# Patient Record
Sex: Male | Born: 2005 | Race: White | Hispanic: No | Marital: Single | Smoking: Current some day smoker
Health system: Southern US, Community
[De-identification: ages and names within clinical notes are randomized; demographics above are authoritative.]

## PROBLEM LIST (undated history)

## (undated) DIAGNOSIS — F419 Anxiety disorder, unspecified: Secondary | ICD-10-CM

## (undated) DIAGNOSIS — G43909 Migraine, unspecified, not intractable, without status migrainosus: Secondary | ICD-10-CM

## (undated) DIAGNOSIS — K3532 Acute appendicitis with perforation and localized peritonitis, without abscess: Secondary | ICD-10-CM

---
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
## 2023-06-05 ENCOUNTER — Other Ambulatory Visit: Payer: Self-pay

## 2023-06-05 ENCOUNTER — Emergency Department (HOSPITAL_COMMUNITY)

## 2023-06-05 ENCOUNTER — Encounter (HOSPITAL_COMMUNITY): Payer: Self-pay | Admitting: *Deleted

## 2023-06-05 ENCOUNTER — Emergency Department (HOSPITAL_COMMUNITY): Admission: EM | Admit: 2023-06-05 | Discharge: 2023-06-05 | Disposition: A | Discharge status: Home / Self Care

## 2023-06-05 DIAGNOSIS — R079 Chest pain, unspecified: Secondary | ICD-10-CM | POA: Diagnosis not present

## 2023-06-05 DIAGNOSIS — D72829 Elevated white blood cell count, unspecified: Secondary | ICD-10-CM | POA: Diagnosis not present

## 2023-06-05 DIAGNOSIS — M542 Cervicalgia: Secondary | ICD-10-CM | POA: Insufficient documentation

## 2023-06-05 DIAGNOSIS — S80211A Abrasion, right knee, initial encounter: Secondary | ICD-10-CM | POA: Diagnosis not present

## 2023-06-05 DIAGNOSIS — Z23 Encounter for immunization: Secondary | ICD-10-CM | POA: Insufficient documentation

## 2023-06-05 DIAGNOSIS — S8991XA Unspecified injury of right lower leg, initial encounter: Secondary | ICD-10-CM | POA: Diagnosis present

## 2023-06-05 DIAGNOSIS — Y92410 Unspecified street and highway as the place of occurrence of the external cause: Secondary | ICD-10-CM | POA: Diagnosis not present

## 2023-06-05 LAB — CBC WITH DIFFERENTIAL/PLATELET
Abs Immature Granulocytes: 0.1 10*3/uL — ABNORMAL HIGH (ref 0.00–0.07)
Basophils Absolute: 0.1 10*3/uL (ref 0.0–0.1)
Basophils Relative: 1 %
Eosinophils Absolute: 0.1 10*3/uL (ref 0.0–0.5)
Eosinophils Relative: 0 %
HCT: 45.8 % (ref 39.0–52.0)
Hemoglobin: 15.4 g/dL (ref 13.0–17.0)
Immature Granulocytes: 1 %
Lymphocytes Relative: 11 %
Lymphs Abs: 2 10*3/uL (ref 0.7–4.0)
MCH: 28.3 pg (ref 26.0–34.0)
MCHC: 33.6 g/dL (ref 30.0–36.0)
MCV: 84.2 fL (ref 80.0–100.0)
Monocytes Absolute: 1.2 10*3/uL — ABNORMAL HIGH (ref 0.1–1.0)
Monocytes Relative: 6 %
Neutro Abs: 15.5 10*3/uL — ABNORMAL HIGH (ref 1.7–7.7)
Neutrophils Relative %: 81 %
Platelets: 301 10*3/uL (ref 150–400)
RBC: 5.44 MIL/uL (ref 4.22–5.81)
RDW: 12.2 % (ref 11.5–15.5)
WBC: 19 10*3/uL — ABNORMAL HIGH (ref 4.0–10.5)
nRBC: 0 % (ref 0.0–0.2)

## 2023-06-05 LAB — COMPREHENSIVE METABOLIC PANEL WITH GFR
ALT: 26 U/L (ref 0–44)
AST: 26 U/L (ref 15–41)
Albumin: 4.2 g/dL (ref 3.5–5.0)
Alkaline Phosphatase: 54 U/L (ref 38–126)
Anion gap: 11 (ref 5–15)
BUN: 18 mg/dL (ref 6–20)
CO2: 24 mmol/L (ref 22–32)
Calcium: 9.3 mg/dL (ref 8.9–10.3)
Chloride: 103 mmol/L (ref 98–111)
Creatinine, Ser: 0.78 mg/dL (ref 0.61–1.24)
GFR, Estimated: >60 mL/min (ref >60)
Glucose, Bld: 103 mg/dL — ABNORMAL HIGH (ref 70–99)
Potassium: 4 mmol/L (ref 3.5–5.1)
Sodium: 138 mmol/L (ref 135–145)
Total Bilirubin: 1 mg/dL (ref 0.0–1.2)
Total Protein: 7.4 g/dL (ref 6.5–8.1)

## 2023-06-05 MED ORDER — FENTANYL CITRATE PF 50 MCG/ML IJ SOSY
25.0000 ug | PREFILLED_SYRINGE | Freq: Once | INTRAMUSCULAR | Status: AC
  Administered 2023-06-05: 25 ug via INTRAVENOUS
  Filled 2023-06-05: qty 1

## 2023-06-05 MED ORDER — KETOROLAC TROMETHAMINE 15 MG/ML IJ SOLN
15.0000 mg | Freq: Once | INTRAMUSCULAR | Status: AC
  Administered 2023-06-05: 15 mg via INTRAVENOUS
  Filled 2023-06-05: qty 1

## 2023-06-05 MED ORDER — IOHEXOL 350 MG/ML SOLN
75.0000 mL | Freq: Once | INTRAVENOUS | Status: AC | PRN
  Administered 2023-06-05: 75 mL via INTRAVENOUS

## 2023-06-05 MED ORDER — ACETAMINOPHEN 500 MG PO TABS: 1000.0000 mg | ORAL_TABLET | Freq: Once | ORAL | Status: DC

## 2023-06-05 MED ORDER — METHOCARBAMOL 500 MG PO TABS: 500.0000 mg | ORAL_TABLET | Freq: Two times a day (BID) | ORAL | 0 refills | Status: AC

## 2023-06-05 MED ORDER — METOCLOPRAMIDE HCL 5 MG/ML IJ SOLN
10.0000 mg | Freq: Once | INTRAMUSCULAR | Status: AC
  Administered 2023-06-05: 10 mg via INTRAVENOUS
  Filled 2023-06-05: qty 2

## 2023-06-05 MED ORDER — TETANUS-DIPHTH-ACELL PERTUSSIS 5-2.5-18.5 LF-MCG/0.5 IM SUSY
0.5000 mL | PREFILLED_SYRINGE | Freq: Once | INTRAMUSCULAR | Status: AC
Start: 2023-06-05 — End: 2023-06-05
  Administered 2023-06-05: 0.5 mL via INTRAMUSCULAR
  Filled 2023-06-05: qty 0.5
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
## 2023-08-06 ENCOUNTER — Emergency Department (HOSPITAL_COMMUNITY)

## 2023-08-06 ENCOUNTER — Other Ambulatory Visit: Payer: Self-pay

## 2023-08-06 ENCOUNTER — Inpatient Hospital Stay (HOSPITAL_COMMUNITY)
Admission: EM | Admit: 2023-08-06 | Discharge: 2023-08-12 | DRG: 959 | Disposition: A | Discharge status: Home / Self Care | Attending: General Surgery | Admitting: General Surgery

## 2023-08-06 DIAGNOSIS — S42352A Displaced comminuted fracture of shaft of humerus, left arm, initial encounter for closed fracture: Secondary | ICD-10-CM | POA: Diagnosis present

## 2023-08-06 DIAGNOSIS — G5632 Lesion of radial nerve, left upper limb: Secondary | ICD-10-CM | POA: Diagnosis present

## 2023-08-06 DIAGNOSIS — G5732 Lesion of lateral popliteal nerve, left lower limb: Secondary | ICD-10-CM | POA: Diagnosis present

## 2023-08-06 DIAGNOSIS — S01112A Laceration without foreign body of left eyelid and periocular area, initial encounter: Secondary | ICD-10-CM | POA: Diagnosis present

## 2023-08-06 DIAGNOSIS — E559 Vitamin D deficiency, unspecified: Secondary | ICD-10-CM | POA: Diagnosis present

## 2023-08-06 DIAGNOSIS — S82492A Other fracture of shaft of left fibula, initial encounter for closed fracture: Secondary | ICD-10-CM | POA: Diagnosis present

## 2023-08-06 DIAGNOSIS — S5422XA Injury of radial nerve at forearm level, left arm, initial encounter: Secondary | ICD-10-CM | POA: Diagnosis present

## 2023-08-06 DIAGNOSIS — S42302A Unspecified fracture of shaft of humerus, left arm, initial encounter for closed fracture: Secondary | ICD-10-CM | POA: Diagnosis not present

## 2023-08-06 DIAGNOSIS — S8412XA Injury of peroneal nerve at lower leg level, left leg, initial encounter: Secondary | ICD-10-CM | POA: Diagnosis present

## 2023-08-06 DIAGNOSIS — Y92410 Unspecified street and highway as the place of occurrence of the external cause: Secondary | ICD-10-CM | POA: Diagnosis not present

## 2023-08-06 DIAGNOSIS — D62 Acute posthemorrhagic anemia: Secondary | ICD-10-CM | POA: Diagnosis not present

## 2023-08-06 DIAGNOSIS — F17200 Nicotine dependence, unspecified, uncomplicated: Secondary | ICD-10-CM | POA: Diagnosis present

## 2023-08-06 DIAGNOSIS — M21372 Foot drop, left foot: Secondary | ICD-10-CM | POA: Diagnosis not present

## 2023-08-06 DIAGNOSIS — E8889 Other specified metabolic disorders: Secondary | ICD-10-CM | POA: Diagnosis present

## 2023-08-06 LAB — CBC
HCT: 44.4 % (ref 39.0–52.0)
Hemoglobin: 14.7 g/dL (ref 13.0–17.0)
MCH: 28.3 pg (ref 26.0–34.0)
MCHC: 33.1 g/dL (ref 30.0–36.0)
MCV: 85.5 fL (ref 80.0–100.0)
Platelets: 309 10*3/uL (ref 150–400)
RBC: 5.19 MIL/uL (ref 4.22–5.81)
RDW: 12.1 % (ref 11.5–15.5)
WBC: 17.4 10*3/uL — ABNORMAL HIGH (ref 4.0–10.5)
nRBC: 0 % (ref 0.0–0.2)

## 2023-08-06 LAB — COMPREHENSIVE METABOLIC PANEL WITH GFR
ALT: 35 U/L (ref 0–44)
AST: 31 U/L (ref 15–41)
Albumin: 3.8 g/dL (ref 3.5–5.0)
Alkaline Phosphatase: 59 U/L (ref 38–126)
Anion gap: 10 (ref 5–15)
BUN: 23 mg/dL — ABNORMAL HIGH (ref 6–20)
CO2: 22 mmol/L (ref 22–32)
Calcium: 8.7 mg/dL — ABNORMAL LOW (ref 8.9–10.3)
Chloride: 109 mmol/L (ref 98–111)
Creatinine, Ser: 0.78 mg/dL (ref 0.61–1.24)
GFR, Estimated: >60 mL/min (ref >60)
Glucose, Bld: 137 mg/dL — ABNORMAL HIGH (ref 70–99)
Potassium: 3.6 mmol/L (ref 3.5–5.1)
Sodium: 141 mmol/L (ref 135–145)
Total Bilirubin: 1.3 mg/dL — ABNORMAL HIGH (ref 0.0–1.2)
Total Protein: 6.5 g/dL (ref 6.5–8.1)

## 2023-08-06 LAB — I-STAT CHEM 8, ED
BUN: 31 mg/dL — ABNORMAL HIGH (ref 6–20)
Calcium, Ion: 1.12 mmol/L — ABNORMAL LOW (ref 1.15–1.40)
Chloride: 105 mmol/L (ref 98–111)
Creatinine, Ser: 0.8 mg/dL (ref 0.61–1.24)
Glucose, Bld: 133 mg/dL — ABNORMAL HIGH (ref 70–99)
HCT: 42 % (ref 39.0–52.0)
Hemoglobin: 14.3 g/dL (ref 13.0–17.0)
Potassium: 3.9 mmol/L (ref 3.5–5.1)
Sodium: 143 mmol/L (ref 135–145)
TCO2: 25 mmol/L (ref 22–32)

## 2023-08-06 LAB — PROTIME-INR
INR: 1 (ref 0.8–1.2)
Prothrombin Time: 13.7 s (ref 11.4–15.2)

## 2023-08-06 LAB — I-STAT CG4 LACTIC ACID, ED: Lactic Acid, Venous: 2.1 mmol/L (ref 0.5–1.9)

## 2023-08-06 LAB — ETHANOL: Alcohol, Ethyl (B): <15 mg/dL (ref <15)

## 2023-08-06 LAB — SAMPLE TO BLOOD BANK

## 2023-08-06 LAB — CK: Total CK: 183 U/L (ref 49–397)

## 2023-08-06 MED ORDER — ONDANSETRON 4 MG PO TBDP: 4.0000 mg | ORAL_TABLET | Freq: Four times a day (QID) | ORAL | Status: DC | PRN

## 2023-08-06 MED ORDER — ENOXAPARIN SODIUM 30 MG/0.3ML IJ SOSY
30.0000 mg | PREFILLED_SYRINGE | Freq: Two times a day (BID) | INTRAMUSCULAR | Status: DC
  Administered 2023-08-07 – 2023-08-12 (×11): 30 mg via SUBCUTANEOUS
  Filled 2023-08-06 (×11): qty 0.3

## 2023-08-06 MED ORDER — POLYETHYLENE GLYCOL 3350 17 G PO PACK: 17.0000 g | PACK | Freq: Every day | ORAL | Status: DC | PRN

## 2023-08-06 MED ORDER — OXYCODONE HCL 5 MG PO TABS
5.0000 mg | ORAL_TABLET | Freq: Four times a day (QID) | ORAL | Status: DC | PRN
  Administered 2023-08-06: 10 mg via ORAL
  Administered 2023-08-07: 5 mg via ORAL
  Administered 2023-08-07 – 2023-08-08 (×3): 10 mg via ORAL
  Administered 2023-08-08: 5 mg via ORAL
  Administered 2023-08-09 – 2023-08-11 (×5): 10 mg via ORAL
  Filled 2023-08-06 (×12): qty 2

## 2023-08-06 MED ORDER — SODIUM CHLORIDE 0.9 % IV SOLN
INTRAVENOUS | Status: AC | PRN
  Administered 2023-08-06: 1000 mL via INTRAVENOUS

## 2023-08-06 MED ORDER — DOCUSATE SODIUM 100 MG PO CAPS
100.0000 mg | ORAL_CAPSULE | Freq: Two times a day (BID) | ORAL | Status: DC
  Administered 2023-08-06 – 2023-08-11 (×10): 100 mg via ORAL
  Filled 2023-08-06 (×12): qty 1

## 2023-08-06 MED ORDER — LACTATED RINGERS IV SOLN: INTRAVENOUS | Status: AC

## 2023-08-06 MED ORDER — ONDANSETRON HCL 4 MG/2ML IJ SOLN
4.0000 mg | Freq: Four times a day (QID) | INTRAMUSCULAR | Status: DC | PRN
  Administered 2023-08-06: 4 mg via INTRAVENOUS
  Filled 2023-08-06: qty 2

## 2023-08-06 MED ORDER — HYDROMORPHONE HCL 1 MG/ML IJ SOLN
0.5000 mg | INTRAMUSCULAR | Status: DC | PRN
  Administered 2023-08-07 – 2023-08-09 (×3): 0.5 mg via INTRAVENOUS
  Filled 2023-08-06 (×3): qty 0.5

## 2023-08-06 MED ORDER — ACETAMINOPHEN 500 MG PO TABS
1000.0000 mg | ORAL_TABLET | Freq: Four times a day (QID) | ORAL | Status: DC
  Administered 2023-08-06 – 2023-08-12 (×20): 1000 mg via ORAL
  Filled 2023-08-06 (×22): qty 2

## 2023-08-06 MED ORDER — MORPHINE SULFATE (PF) 4 MG/ML IV SOLN
4.0000 mg | Freq: Once | INTRAVENOUS | Status: AC
  Administered 2023-08-06: 4 mg via INTRAVENOUS
  Filled 2023-08-06: qty 1

## 2023-08-06 MED ORDER — HYDROXYZINE HCL 25 MG PO TABS
25.0000 mg | ORAL_TABLET | Freq: Once | ORAL | Status: AC
  Administered 2023-08-06: 25 mg via ORAL
  Filled 2023-08-06: qty 1

## 2023-08-06 MED ORDER — HYDRALAZINE HCL 20 MG/ML IJ SOLN: 10.0000 mg | INTRAMUSCULAR | Status: DC | PRN

## 2023-08-06 MED ORDER — CEFAZOLIN SODIUM-DEXTROSE 2-4 GM/100ML-% IV SOLN
2.0000 g | Freq: Once | INTRAVENOUS | Status: AC
  Administered 2023-08-06: 2 g via INTRAVENOUS
  Filled 2023-08-06: qty 100

## 2023-08-06 MED ORDER — METOPROLOL TARTRATE 5 MG/5ML IV SOLN: 5.0000 mg | Freq: Four times a day (QID) | INTRAVENOUS | Status: DC | PRN

## 2023-08-06 MED ORDER — METHOCARBAMOL 1000 MG/10ML IJ SOLN
500.0000 mg | Freq: Three times a day (TID) | INTRAMUSCULAR | Status: AC
  Filled 2023-08-06: qty 10

## 2023-08-06 MED ORDER — IBUPROFEN 600 MG PO TABS
600.0000 mg | ORAL_TABLET | Freq: Four times a day (QID) | ORAL | Status: DC | PRN
  Administered 2023-08-06 – 2023-08-10 (×5): 600 mg via ORAL
  Filled 2023-08-06 (×6): qty 1

## 2023-08-06 MED ORDER — METHOCARBAMOL 500 MG PO TABS
500.0000 mg | ORAL_TABLET | Freq: Three times a day (TID) | ORAL | Status: AC
  Administered 2023-08-06 – 2023-08-09 (×9): 500 mg via ORAL
  Filled 2023-08-06 (×9): qty 1

## 2023-08-06 MED ORDER — MELATONIN 3 MG PO TABS
3.0000 mg | ORAL_TABLET | Freq: Every evening | ORAL | Status: DC | PRN
Start: 2023-08-06 — End: 2023-08-12

## 2023-08-06 MED ORDER — IOHEXOL 350 MG/ML SOLN
75.0000 mL | Freq: Once | INTRAVENOUS | Status: AC | PRN
  Administered 2023-08-06: 75 mL via INTRAVENOUS
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
## 2023-08-06 NOTE — Progress Notes (Signed)
 Orthopedic Tech Progress Note Patient Details:  Taz ATREUS HASZ 07-22-2005 [id number]  Ortho Devices Type of Ortho Device: Velcro wrist splint, Ankle splint, Coapt Ortho Device/Splint Location: LUE Ortho Device/Splint Interventions: Application   Post Interventions Patient Tolerated: Well  Massie BRAVO Nicolai Labonte 08/06/2023, 1:57 PM
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
## 2023-08-06 NOTE — ED Notes (Signed)
 Call placed to ortho tech for splint application.

## 2023-08-06 NOTE — ED Notes (Signed)
2LNC applied 

## 2023-08-06 NOTE — Progress Notes (Signed)
 Transition of Care [facility]) - CAGE-AID Screening   Patient Details  Name: Craig Santos MRN: [id number] Date of Birth: [birth year]  [facility]) CM/SW Contact:    Bernardino Mayotte, RN Phone Number: 08/06/2023, 7:49 PM   Clinical Narrative:  Patient denies the use of alcohol and illicit substances. Resources not give at this time.  CAGE-AID Screening:    Have You Ever Felt You Ought to Cut Down on Your Drinking or Drug Use?: No Have People Annoyed You By Critizing Your Drinking Or Drug Use?: No Have You Felt Bad Or Guilty About Your Drinking Or Drug Use?: No Have You Ever Had a Drink or Used Drugs First Thing In The Morning to Steady Your Nerves or to Get Rid of a Hangover?: No CAGE-AID Score: 0  Substance Abuse Education Offered: No
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
## 2023-08-06 NOTE — ED Provider Notes (Signed)
  Physical Exam  BP 137/79   Pulse 65   Temp (!) 97.4 F (36.3 C) (Temporal)   Resp 15   Ht 5' 11 (1.803 m)   Wt 104.3 kg   SpO2 98%   BMI 32.08 kg/m     Procedures  .Laceration Repair  Date/Time: 08/06/2023 2:35 PM  Performed by: Donnajean Lynwood DEL, PA-C Authorized by: Donnajean Lynwood DEL, PA-C   Consent:    Consent obtained:  Verbal   Consent given by:  Patient   Risks discussed:  Infection and pain   Alternatives discussed:  No treatment Universal protocol:    Procedure explained and questions answered to patient or proxy's satisfaction: yes     Patient identity confirmed:  Verbally with patient and arm band Anesthesia:    Anesthesia method:  None Laceration details:    Location:  Face   Face location:  L eyebrow   Length (cm):  3 Exploration:    Imaging outcome: foreign body noted   Treatment:    Area cleansed with:  Povidone-iodine   Visualized foreign bodies/material removed: yes   Skin repair:    Repair method:  Tissue adhesive Post-procedure details:    Procedure completion:  Quinton Donnajean Lynwood DEL, PA-C 08/06/23 1437    Dreama Longs, MD 08/06/23 2254

## 2023-08-06 NOTE — ED Notes (Signed)
 C-collar removed per Dreama MD verbal order.

## 2023-08-06 NOTE — ED Notes (Signed)
 Patient transported to X-ray
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
## 2023-08-06 NOTE — ED Notes (Signed)
 CCMD contacted

## 2023-08-07 LAB — CBC
HCT: 39.9 % (ref 39.0–52.0)
Hemoglobin: 13.5 g/dL (ref 13.0–17.0)
MCH: 28 pg (ref 26.0–34.0)
MCHC: 33.8 g/dL (ref 30.0–36.0)
MCV: 82.8 fL (ref 80.0–100.0)
Platelets: 234 10*3/uL (ref 150–400)
RBC: 4.82 MIL/uL (ref 4.22–5.81)
RDW: 12.4 % (ref 11.5–15.5)
WBC: 10.6 10*3/uL — ABNORMAL HIGH (ref 4.0–10.5)
nRBC: 0 % (ref 0.0–0.2)

## 2023-08-07 LAB — CK: Total CK: 756 U/L — ABNORMAL HIGH (ref 49–397)

## 2023-08-07 LAB — HIV ANTIBODY (ROUTINE TESTING W REFLEX): HIV Screen 4th Generation wRfx: NONREACTIVE

## 2023-08-07 MED ORDER — BACITRACIN ZINC 500 UNIT/GM EX OINT
TOPICAL_OINTMENT | Freq: Two times a day (BID) | CUTANEOUS | Status: DC
  Administered 2023-08-07 – 2023-08-12 (×10): 31.5 via TOPICAL
  Filled 2023-08-07 (×3): qty 28.35
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
## 2023-08-07 NOTE — Progress Notes (Signed)
 PT reached out to ortho regarding patient showing a foot drop while ambulating and requiring excessive hip/knee flexion to clear the foot. Reassessed patient in room this afternoon where he shows ability to wiggle toes but inability to dorsiflex left foot and ankle. He has numbness to light touch along the great toe of the left foot as well. Compartments remain soft in BLE. Distal pulses 2+. Patient tolerates gentle ROM of the left knee but is significantly tender along the peroneal nerve near the fibular head. Patient denies lumbar pain and radicular pain into the legs from the lumbar spine. Will order AFO for left foot.ankle and will order MRI of the left knee for evaluation of the peroneal nerve.   [address] [place], [place] 08/07/23 1341

## 2023-08-07 NOTE — Progress Notes (Signed)
 Orthopedic Tech Progress Note Patient Details:  Craig Santos 2005/09/13 [id number]  Patient ID: Craig Santos, male   DOB: [birth year], 18 y.o.   MRN: [id number] AFO consult placed with [facility]. Craig Santos 08/07/2023, 2:58 PM
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
## 2023-08-08 ENCOUNTER — Inpatient Hospital Stay (HOSPITAL_COMMUNITY)

## 2023-08-08 MED ORDER — ENSURE PLUS HIGH PROTEIN PO LIQD
237.0000 mL | Freq: Two times a day (BID) | ORAL | Status: DC
  Administered 2023-08-08 – 2023-08-12 (×8): 237 mL via ORAL

## 2023-08-08 MED ORDER — GABAPENTIN 100 MG PO CAPS
200.0000 mg | ORAL_CAPSULE | Freq: Three times a day (TID) | ORAL | Status: DC
  Administered 2023-08-08 (×3): 200 mg via ORAL
  Filled 2023-08-08 (×3): qty 2
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
## 2023-08-08 NOTE — Plan of Care (Signed)
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
## 2023-08-08 NOTE — Progress Notes (Signed)
 Transition of Care [facility]) - Inpatient Brief Assessment   Patient Details  Name: Craig Santos MRN: [id number] Date of Birth: 09-Dec-2005  [facility]) CM/SW Contact:    Robynn Eileen Hoose, RN Phone Number: 08/08/2023, 3:31 PM   Clinical Narrative: PT/OT recommendations for outpatient PT/OT noted. Referral # [id number], placed for [facility]. Contact information placed on AVS.   Transition of Care Asessment: Insurance and Status: (P) Insurance coverage has been reviewed Patient has primary care physician: (P) No Home environment has been reviewed: (P) Home Prior level of function:: (P) Independent Prior/Current Home Services: (P) No current home services Social Drivers of Health Review: (P) SDOH reviewed no interventions necessary Readmission risk has been reviewed: (P) Yes Transition of care needs: (P) transition of care needs identified, TOC will continue to follow
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
## 2023-08-09 ENCOUNTER — Inpatient Hospital Stay (HOSPITAL_COMMUNITY)

## 2023-08-09 ENCOUNTER — Encounter (HOSPITAL_COMMUNITY): Admission: EM | Disposition: A | Discharge status: Home / Self Care | Payer: Self-pay | Source: Home / Self Care

## 2023-08-09 ENCOUNTER — Other Ambulatory Visit: Payer: Self-pay

## 2023-08-09 ENCOUNTER — Encounter (HOSPITAL_COMMUNITY): Payer: Self-pay

## 2023-08-09 DIAGNOSIS — S42302A Unspecified fracture of shaft of humerus, left arm, initial encounter for closed fracture: Secondary | ICD-10-CM

## 2023-08-09 HISTORY — PX: ORIF HUMERUS FRACTURE: SHX2126

## 2023-08-09 SURGERY — OPEN REDUCTION INTERNAL FIXATION (ORIF) HUMERAL SHAFT FRACTURE
Anesthesia: General | Site: Arm Upper | Laterality: Left

## 2023-08-09 MED ORDER — ROCURONIUM BROMIDE 10 MG/ML (PF) SYRINGE
PREFILLED_SYRINGE | INTRAVENOUS | Status: AC
Start: 2023-08-09 — End: 2023-08-09
  Filled 2023-08-09: qty 10

## 2023-08-09 MED ORDER — TRANEXAMIC ACID-NACL 1000-0.7 MG/100ML-% IV SOLN
1000.0000 mg | INTRAVENOUS | Status: AC
  Administered 2023-08-09: 1000 mg via INTRAVENOUS
  Filled 2023-08-09 (×2): qty 100

## 2023-08-09 MED ORDER — DEXAMETHASONE SODIUM PHOSPHATE 10 MG/ML IJ SOLN
INTRAMUSCULAR | Status: AC
  Filled 2023-08-09: qty 1

## 2023-08-09 MED ORDER — LIDOCAINE 2% (20 MG/ML) 5 ML SYRINGE
INTRAMUSCULAR | Status: AC
  Filled 2023-08-09: qty 5

## 2023-08-09 MED ORDER — LACTATED RINGERS IV SOLN: INTRAVENOUS | Status: DC | PRN

## 2023-08-09 MED ORDER — PROPOFOL 10 MG/ML IV BOLUS
INTRAVENOUS | Status: AC
  Filled 2023-08-09: qty 20

## 2023-08-09 MED ORDER — SUGAMMADEX SODIUM 200 MG/2ML IV SOLN
INTRAVENOUS | Status: DC | PRN
  Administered 2023-08-09: 200 mg via INTRAVENOUS

## 2023-08-09 MED ORDER — HYDROMORPHONE HCL 1 MG/ML IJ SOLN
0.5000 mg | INTRAMUSCULAR | Status: DC | PRN
  Administered 2023-08-09 – 2023-08-10 (×5): 1 mg via INTRAVENOUS
  Filled 2023-08-09 (×6): qty 1

## 2023-08-09 MED ORDER — MIDAZOLAM HCL 2 MG/2ML IJ SOLN
INTRAMUSCULAR | Status: AC
  Filled 2023-08-09: qty 2

## 2023-08-09 MED ORDER — ONDANSETRON HCL 4 MG/2ML IJ SOLN: 4.0000 mg | Freq: Once | INTRAMUSCULAR | Status: DC | PRN

## 2023-08-09 MED ORDER — LACTATED RINGERS IV SOLN: INTRAVENOUS | Status: AC

## 2023-08-09 MED ORDER — 0.9 % SODIUM CHLORIDE (POUR BTL) OPTIME
TOPICAL | Status: DC | PRN
  Administered 2023-08-09: 1000 mL

## 2023-08-09 MED ORDER — BUPIVACAINE HCL (PF) 0.5 % IJ SOLN
INTRAMUSCULAR | Status: DC | PRN
  Administered 2023-08-09: 15 mL via PERINEURAL

## 2023-08-09 MED ORDER — EPHEDRINE 5 MG/ML INJ
INTRAVENOUS | Status: AC
  Filled 2023-08-09: qty 5

## 2023-08-09 MED ORDER — LIDOCAINE 2% (20 MG/ML) 5 ML SYRINGE
INTRAMUSCULAR | Status: DC | PRN
  Administered 2023-08-09: 20 mg via INTRAVENOUS

## 2023-08-09 MED ORDER — DEXAMETHASONE SODIUM PHOSPHATE 10 MG/ML IJ SOLN
INTRAMUSCULAR | Status: DC | PRN
  Administered 2023-08-09: 10 mg via INTRAVENOUS

## 2023-08-09 MED ORDER — DEXMEDETOMIDINE HCL IN NACL 80 MCG/20ML IV SOLN
INTRAVENOUS | Status: DC | PRN
Start: 2023-08-09 — End: 2023-08-09
  Administered 2023-08-09 (×2): 8 ug via INTRAVENOUS

## 2023-08-09 MED ORDER — PHENYLEPHRINE 80 MCG/ML (10ML) SYRINGE FOR IV PUSH (FOR BLOOD PRESSURE SUPPORT)
PREFILLED_SYRINGE | INTRAVENOUS | Status: DC | PRN
  Administered 2023-08-09: 80 ug via INTRAVENOUS

## 2023-08-09 MED ORDER — GABAPENTIN 300 MG PO CAPS
300.0000 mg | ORAL_CAPSULE | Freq: Three times a day (TID) | ORAL | Status: DC
  Administered 2023-08-09 – 2023-08-11 (×7): 300 mg via ORAL
  Filled 2023-08-09 (×7): qty 1

## 2023-08-09 MED ORDER — BUPIVACAINE LIPOSOME 1.3 % IJ SUSP
INTRAMUSCULAR | Status: DC | PRN
  Administered 2023-08-09: 10 mL via PERINEURAL

## 2023-08-09 MED ORDER — CEFAZOLIN SODIUM-DEXTROSE 2-4 GM/100ML-% IV SOLN
2.0000 g | INTRAVENOUS | Status: AC
  Administered 2023-08-09: 2 g via INTRAVENOUS
  Filled 2023-08-09: qty 100

## 2023-08-09 MED ORDER — ONDANSETRON HCL 4 MG/2ML IJ SOLN
INTRAMUSCULAR | Status: AC
  Filled 2023-08-09: qty 2

## 2023-08-09 MED ORDER — MIDAZOLAM HCL 2 MG/2ML IJ SOLN
INTRAMUSCULAR | Status: DC | PRN
  Administered 2023-08-09: 2 mg via INTRAVENOUS

## 2023-08-09 MED ORDER — HYDROMORPHONE HCL 1 MG/ML IJ SOLN: 0.2500 mg | INTRAMUSCULAR | Status: DC | PRN

## 2023-08-09 MED ORDER — FENTANYL CITRATE (PF) 100 MCG/2ML IJ SOLN
INTRAMUSCULAR | Status: AC
  Administered 2023-08-09: 100 ug
  Filled 2023-08-09: qty 2

## 2023-08-09 MED ORDER — CEFAZOLIN SODIUM-DEXTROSE 2-4 GM/100ML-% IV SOLN: 2.0000 g | INTRAVENOUS | Status: DC

## 2023-08-09 MED ORDER — CHLORHEXIDINE GLUCONATE 4 % EX SOLN
60.0000 mL | Freq: Once | CUTANEOUS | Status: DC
  Filled 2023-08-09: qty 60

## 2023-08-09 MED ORDER — ROCURONIUM BROMIDE 10 MG/ML (PF) SYRINGE
PREFILLED_SYRINGE | INTRAVENOUS | Status: DC | PRN
  Administered 2023-08-09: 60 mg via INTRAVENOUS
  Administered 2023-08-09: 20 mg via INTRAVENOUS

## 2023-08-09 MED ORDER — PROPOFOL 10 MG/ML IV BOLUS
INTRAVENOUS | Status: DC | PRN
  Administered 2023-08-09: 200 mg via INTRAVENOUS

## 2023-08-09 MED ORDER — MIDAZOLAM HCL 2 MG/2ML IJ SOLN
INTRAMUSCULAR | Status: AC
  Administered 2023-08-09: 2 mg
  Filled 2023-08-09: qty 2

## 2023-08-09 MED ORDER — OXYCODONE HCL 5 MG/5ML PO SOLN: 5.0000 mg | Freq: Once | ORAL | Status: DC | PRN

## 2023-08-09 MED ORDER — CEFAZOLIN SODIUM-DEXTROSE 2-4 GM/100ML-% IV SOLN
2.0000 g | Freq: Three times a day (TID) | INTRAVENOUS | Status: AC
  Administered 2023-08-10 (×3): 2 g via INTRAVENOUS
  Filled 2023-08-09 (×3): qty 100

## 2023-08-09 MED ORDER — PHENYLEPHRINE 80 MCG/ML (10ML) SYRINGE FOR IV PUSH (FOR BLOOD PRESSURE SUPPORT)
PREFILLED_SYRINGE | INTRAVENOUS | Status: AC
Start: 2023-08-09 — End: 2023-08-09
  Filled 2023-08-09: qty 10

## 2023-08-09 MED ORDER — FENTANYL CITRATE (PF) 250 MCG/5ML IJ SOLN
INTRAMUSCULAR | Status: AC
  Filled 2023-08-09: qty 5

## 2023-08-09 MED ORDER — OXYCODONE HCL 5 MG PO TABS: 5.0000 mg | ORAL_TABLET | Freq: Once | ORAL | Status: DC | PRN

## 2023-08-09 MED ORDER — FENTANYL CITRATE (PF) 250 MCG/5ML IJ SOLN
INTRAMUSCULAR | Status: DC | PRN
  Administered 2023-08-09: 100 ug via INTRAVENOUS

## 2023-08-09 MED ORDER — POVIDONE-IODINE 10 % EX SWAB: 2.0000 | Freq: Once | CUTANEOUS | Status: DC

## 2023-08-09 MED ORDER — BUPIVACAINE LIPOSOME 1.3 % IJ SUSP
INTRAMUSCULAR | Status: AC
  Filled 2023-08-09: qty 10

## 2023-08-09 MED ORDER — ONDANSETRON HCL 4 MG/2ML IJ SOLN
INTRAMUSCULAR | Status: DC | PRN
  Administered 2023-08-09: 4 mg via INTRAVENOUS

## 2023-08-09 SURGICAL SUPPLY — 53 items
BAG COUNTER SPONGE SURGICOUNT (BAG) ×1 IMPLANT
BENZOIN TINCTURE PRP APPL 2/3 (GAUZE/BANDAGES/DRESSINGS) ×2 IMPLANT
BIT DRILL X LONG 2.5 (BIT) IMPLANT
BNDG COMPR ESMARK 4X3 LF (GAUZE/BANDAGES/DRESSINGS) ×1 IMPLANT
BNDG GAUZE DERMACEA FLUFF 4 (GAUZE/BANDAGES/DRESSINGS) ×2 IMPLANT
BRUSH SCRUB EZ PLAIN DRY (MISCELLANEOUS) ×2 IMPLANT
CORD BIPOLAR FORCEPS 12FT (ELECTRODE) ×1 IMPLANT
COVER SURGICAL LIGHT HANDLE (MISCELLANEOUS) ×2 IMPLANT
DRAPE C-ARM 42X72 X-RAY (DRAPES) ×1 IMPLANT
DRAPE SURG ORHT 6 SPLT 77X108 (DRAPES) ×2 IMPLANT
DRAPE U-SHAPE 47X51 STRL (DRAPES) ×2 IMPLANT
DRSG ADAPTIC 3X8 NADH LF (GAUZE/BANDAGES/DRESSINGS) ×1 IMPLANT
DRSG MEPILEX POST OP 4X8 (GAUZE/BANDAGES/DRESSINGS) IMPLANT
ELECTRODE REM PT RTRN 9FT ADLT (ELECTROSURGICAL) ×1 IMPLANT
EVACUATOR 1/8 PVC DRAIN (DRAIN) IMPLANT
GAUZE PAD ABD 8X10 STRL (GAUZE/BANDAGES/DRESSINGS) ×1 IMPLANT
GAUZE SPONGE 4X4 12PLY STRL (GAUZE/BANDAGES/DRESSINGS) ×2 IMPLANT
GLOVE BIO SURGEON STRL SZ7.5 (GLOVE) ×1 IMPLANT
GLOVE BIO SURGEON STRL SZ8 (GLOVE) ×1 IMPLANT
GLOVE BIOGEL PI IND STRL 7.5 (GLOVE) ×1 IMPLANT
GLOVE BIOGEL PI IND STRL 8 (GLOVE) ×1 IMPLANT
GLOVE SURG ORTHO LTX SZ7.5 (GLOVE) ×2 IMPLANT
GOWN STRL REUS W/ TWL LRG LVL3 (GOWN DISPOSABLE) ×2 IMPLANT
GOWN STRL REUS W/ TWL XL LVL3 (GOWN DISPOSABLE) ×1 IMPLANT
KIT BASIN OR (CUSTOM PROCEDURE TRAY) ×1 IMPLANT
KIT TURNOVER KIT B (KITS) ×1 IMPLANT
KWIRE 1.6X150 (WIRE) IMPLANT
MANIFOLD NEPTUNE II (INSTRUMENTS) ×1 IMPLANT
NDL HYPO 25X1 1.5 SAFETY (NEEDLE) ×1 IMPLANT
NEEDLE HYPO 25X1 1.5 SAFETY (NEEDLE) ×1 IMPLANT
NS IRRIG 1000ML POUR BTL (IV SOLUTION) ×1 IMPLANT
PACK ORTHO EXTREMITY (CUSTOM PROCEDURE TRAY) ×1 IMPLANT
PAD ARMBOARD POSITIONER FOAM (MISCELLANEOUS) ×2 IMPLANT
PROSTHESIS LCP PLATE 10 137M (Plate) IMPLANT
SCREW LOCK CORT ST 3.5X22 (Screw) IMPLANT
SCREW LOCK CORT ST 3.5X24 (Screw) IMPLANT
SCREW LOCK CORT ST 3.5X26 (Screw) IMPLANT
SPONGE T-LAP 18X18 ~~LOC~~+RFID (SPONGE) ×1 IMPLANT
STAPLER SKIN PROX 35W (STAPLE) ×1 IMPLANT
SUCTION TUBE FRAZIER 10FR DISP (SUCTIONS) ×1 IMPLANT
SUT ETHILON 2 0 FS 18 (SUTURE) ×2 IMPLANT
SUT PDS AB 2-0 CT1 27 (SUTURE) IMPLANT
SUT VIC AB 0 CT1 27XBRD ANBCTR (SUTURE) ×2 IMPLANT
SUT VIC AB 1 CT1 27XBRD ANBCTR (SUTURE) IMPLANT
SUT VIC AB 2-0 CT1 TAPERPNT 27 (SUTURE) ×2 IMPLANT
SYR 5ML LL (SYRINGE) IMPLANT
SYR CONTROL 10ML LL (SYRINGE) ×1 IMPLANT
TOWEL GREEN STERILE (TOWEL DISPOSABLE) ×3 IMPLANT
TOWEL GREEN STERILE FF (TOWEL DISPOSABLE) ×1 IMPLANT
TRAY FOLEY MTR SLVR 16FR STAT (SET/KITS/TRAYS/PACK) IMPLANT
TUBE CONNECTING 12X1/4 (SUCTIONS) ×1 IMPLANT
WATER STERILE IRR 1000ML POUR (IV SOLUTION) ×1 IMPLANT
YANKAUER SUCT BULB TIP NO VENT (SUCTIONS) IMPLANT
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
## 2023-08-09 NOTE — Plan of Care (Signed)
  Problem: Coping: Goal: Level of anxiety will decrease Outcome: Progressing   Problem: Pain Managment: Goal: General experience of comfort will improve and/or be controlled Outcome: Progressing   Problem: Safety: Goal: Ability to remain free from injury will improve Outcome: Progressing   Problem: Skin Integrity: Goal: Risk for impaired skin integrity will decrease Outcome: Progressing

## 2023-08-09 NOTE — Anesthesia Procedure Notes (Addendum)
 Procedure Name: Intubation Date/Time: 08/09/2023 4:20 PM  Performed by: Arvell Edsel HERO, CRNAPre-anesthesia Checklist: Patient identified, Emergency Drugs available, Suction available, Patient being monitored and Timeout performed Patient Re-evaluated:Patient Re-evaluated prior to induction Oxygen Delivery Method: Circle system utilized Preoxygenation: Pre-oxygenation with 100% oxygen Induction Type: IV induction Ventilation: Mask ventilation without difficulty Laryngoscope Size: Mac and 4 Grade View: Grade I Tube size: 7.0 mm Number of attempts: 1 Airway Equipment and Method: Stylet and Patient positioned with wedge pillow Placement Confirmation: ETT inserted through vocal cords under direct vision, positive ETCO2, CO2 detector and breath sounds checked- equal and bilateral Secured at: 23 cm Tube secured with: Tape

## 2023-08-09 NOTE — Anesthesia Postprocedure Evaluation (Signed)
 Anesthesia Post Note  Patient: Craig Santos  Procedure(s) Performed: OPEN REDUCTION INTERNAL FIXATION (ORIF) HUMERAL SHAFT FRACTURE (Left: Arm Upper)     Patient location during evaluation: PACU Anesthesia Type: General Level of consciousness: awake and alert, oriented and patient cooperative Pain management: pain level controlled Vital Signs Assessment: post-procedure vital signs reviewed and stable Respiratory status: spontaneous breathing, nonlabored ventilation and respiratory function stable Cardiovascular status: blood pressure returned to baseline and stable Postop Assessment: no apparent nausea or vomiting Anesthetic complications: no  No notable events documented.  Last Vitals:  Vitals:   08/09/23 1821 08/09/23 1841  BP: (!) 132/95 134/82  Pulse: 91 85  Resp: 18 17  Temp:  36.4 C  SpO2: 95% 97%    Last Pain:  Vitals:   08/09/23 1841  TempSrc: Oral  PainSc:                  Angeleah Labrake,E. Okley Magnussen

## 2023-08-09 NOTE — Anesthesia Procedure Notes (Signed)
 Anesthesia Regional Block: Interscalene brachial plexus block   Pre-Anesthetic Checklist: , timeout performed,  Correct Patient, Correct Site, Correct Laterality,  Correct Procedure, Correct Position, site marked,  Risks and benefits discussed,  Surgical consent,  Pre-op evaluation,  At surgeon's request and post-op pain management  Laterality: Left  Prep: chloraprep       Needles:  Injection technique: Single-shot  Needle Type: Echogenic Stimulator Needle     Needle Length: 10cm  Needle Gauge: 21   Needle insertion depth: 6 cm   Additional Needles:   Procedures:,,,, ultrasound used (permanent image in chart),,    Narrative:  Start time: 08/09/2023 3:18 PM End time: 08/09/2023 3:23 PM Injection made incrementally with aspirations every 5 mL.  Performed by: Personally  Anesthesiologist: Jerrye Sharper, MD  Additional Notes: Timeout performed. Patient sedated. Relevant anatomy ID'd using US . Incremental 2-5ml injection of LA with frequent aspiration. Patient tolerated procedure well.

## 2023-08-09 NOTE — Progress Notes (Signed)
 Orthopedic Tech Progress Note Patient Details:  Craig Santos 05/08/2005 [id number]  Ortho Devices Type of Ortho Device: Arm sling Ortho Device/Splint Location: LUE Ortho Device/Splint Interventions: Application, Ordered, Adjustment   Post Interventions Patient Tolerated: Well Instructions Provided: Care of device, Adjustment of device  [name] 08/09/2023, 7:50 PM

## 2023-08-09 NOTE — Transfer of Care (Signed)
 Immediate Anesthesia Transfer of Care Note  Patient: Craig Santos  Procedure(s) Performed: OPEN REDUCTION INTERNAL FIXATION (ORIF) HUMERAL SHAFT FRACTURE (Left: Arm Upper)  Patient Location: PACU  Anesthesia Type:General  Level of Consciousness: drowsy and patient cooperative  Airway & Oxygen Therapy: Patient Spontanous Breathing and Patient connected to face mask oxygen  Post-op Assessment: Report given to RN and Post -op Vital signs reviewed and stable  Post vital signs: Reviewed and stable  Last Vitals:  Vitals Value Taken Time  BP 113/56 08/09/23 17:52  Temp    Pulse 78 08/09/23 17:55  Resp 11 08/09/23 17:55  SpO2 95 % 08/09/23 17:55  Vitals shown include unfiled device data.  Last Pain:  Vitals:   08/09/23 1459  TempSrc: Oral  PainSc: 5          Complications: No notable events documented.

## 2023-08-09 NOTE — Progress Notes (Signed)
 Mobility Specialist Progress Note:   08/09/23 1025  Mobility  Activity Ambulated with assistance to bathroom  Level of Assistance Contact guard assist, steadying assist  Assistive Device Other (Comment) (HHA)  LUE Weight Bearing Per Provider Order NWB  Activity Response Tolerated well  Mobility Referral Yes  Mobility visit 1 Mobility  Mobility Specialist Start Time (ACUTE ONLY) 1010  Mobility Specialist Stop Time (ACUTE ONLY) 1020  Mobility Specialist Time Calculation (min) (ACUTE ONLY) 10 min   Pt received in bed and agreeable. Declined ambulation at the moment, but agreeable to stand at bedside d/t needing to void. No c/o throughout and void successful. Patient left in bed. Family present. Personal belongings and call light within reach. All needs met.  Lavanda Pollack Mobility Specialist  Please contact via [employer] or  Rehab Office [phone]

## 2023-08-09 NOTE — Plan of Care (Signed)
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
## 2023-08-09 NOTE — Anesthesia Preprocedure Evaluation (Signed)
 Anesthesia Evaluation  Patient identified by MRN, date of birth, ID band Patient awake    Reviewed: Allergy & Precautions, NPO status , Patient's Chart, lab work & pertinent test results  Airway Mallampati: II  TM Distance: >3 FB Neck ROM: Full    Dental no notable dental hx. (+) Dental Advisory Given, Teeth Intact   Pulmonary Patient abstained from smoking.   Pulmonary exam normal breath sounds clear to auscultation       Cardiovascular negative cardio ROS Normal cardiovascular exam Rhythm:Regular Rate:Normal     Neuro/Psych negative neurological ROS  negative psych ROS   GI/Hepatic negative GI ROS, Neg liver ROS,,,  Endo/Other  negative endocrine ROS    Renal/GU negative Renal ROS  negative genitourinary   Musculoskeletal Fx left humerus   Abdominal   Peds  Hematology negative hematology ROS (+)   Anesthesia Other Findings   Reproductive/Obstetrics                             Anesthesia Physical Anesthesia Plan  ASA: 1  Anesthesia Plan: General   Post-op Pain Management: Regional block* and Minimal or no pain anticipated   Induction: Intravenous  PONV Risk Score and Plan: 3 and Treatment may vary due to age or medical condition and Ondansetron   Airway Management Planned: Oral ETT  Additional Equipment: None  Intra-op Plan:   Post-operative Plan: Extubation in OR  Informed Consent: I have reviewed the patients History and Physical, chart, labs and discussed the procedure including the risks, benefits and alternatives for the proposed anesthesia with the patient or authorized representative who has indicated his/her understanding and acceptance.     Dental advisory given  Plan Discussed with: CRNA and Anesthesiologist  Anesthesia Plan Comments:        Anesthesia Quick Evaluation
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
## 2023-08-10 ENCOUNTER — Encounter (HOSPITAL_COMMUNITY): Payer: Self-pay | Admitting: Orthopedic Surgery

## 2023-08-10 DIAGNOSIS — G5732 Lesion of lateral popliteal nerve, left lower limb: Secondary | ICD-10-CM | POA: Diagnosis present

## 2023-08-10 DIAGNOSIS — G5632 Lesion of radial nerve, left upper limb: Secondary | ICD-10-CM | POA: Diagnosis present

## 2023-08-10 DIAGNOSIS — S42352A Displaced comminuted fracture of shaft of humerus, left arm, initial encounter for closed fracture: Secondary | ICD-10-CM | POA: Diagnosis present

## 2023-08-10 LAB — BASIC METABOLIC PANEL WITH GFR
Anion gap: 8 (ref 5–15)
BUN: 15 mg/dL (ref 6–20)
CO2: 24 mmol/L (ref 22–32)
Calcium: 8.5 mg/dL — ABNORMAL LOW (ref 8.9–10.3)
Chloride: 104 mmol/L (ref 98–111)
Creatinine, Ser: 0.65 mg/dL (ref 0.61–1.24)
GFR, Estimated: >60 mL/min (ref >60)
Glucose, Bld: 113 mg/dL — ABNORMAL HIGH (ref 70–99)
Potassium: 4 mmol/L (ref 3.5–5.1)
Sodium: 136 mmol/L (ref 135–145)

## 2023-08-10 LAB — CBC
HCT: 36.8 % — ABNORMAL LOW (ref 39.0–52.0)
Hemoglobin: 12.5 g/dL — ABNORMAL LOW (ref 13.0–17.0)
MCH: 28 pg (ref 26.0–34.0)
MCHC: 34 g/dL (ref 30.0–36.0)
MCV: 82.3 fL (ref 80.0–100.0)
Platelets: 276 10*3/uL (ref 150–400)
RBC: 4.47 MIL/uL (ref 4.22–5.81)
RDW: 12 % (ref 11.5–15.5)
WBC: 12.7 10*3/uL — ABNORMAL HIGH (ref 4.0–10.5)
nRBC: 0 % (ref 0.0–0.2)

## 2023-08-10 MED ORDER — VITAMIN C 500 MG PO TABS
1000.0000 mg | ORAL_TABLET | Freq: Every day | ORAL | Status: DC
  Administered 2023-08-10 – 2023-08-12 (×3): 1000 mg via ORAL
  Filled 2023-08-10 (×3): qty 2

## 2023-08-10 MED ORDER — POLYETHYLENE GLYCOL 3350 17 G PO PACK
17.0000 g | PACK | Freq: Two times a day (BID) | ORAL | Status: DC
  Administered 2023-08-10 (×2): 17 g via ORAL
  Filled 2023-08-10 (×5): qty 1
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
## 2023-08-10 NOTE — Plan of Care (Signed)

## 2023-08-10 NOTE — Progress Notes (Signed)
 Orthopedic Tech Progress Note Patient Details:  Craig Santos 2005/03/30 [id number]  Ortho Devices Type of Ortho Device: CAM walker Ortho Device/Splint Location: LLE Ortho Device/Splint Interventions: Ordered, Adjustment, Application  Applied cam boot and instructed patient on proper adjustment and care   Post Interventions Patient Tolerated: Well Instructions Provided: Adjustment of device, Care of device, Other (comment)  [name] 08/10/2023, 9:21 AM

## 2023-08-11 ENCOUNTER — Encounter (HOSPITAL_COMMUNITY): Payer: Self-pay

## 2023-08-11 LAB — NASOPHARYNGEAL CULTURE: Culture: NORMAL

## 2023-08-11 LAB — VITAMIN D 25 HYDROXY (VIT D DEFICIENCY, FRACTURES): Vit D, 25-Hydroxy: 14.71 ng/mL — ABNORMAL LOW (ref 30–100)

## 2023-08-11 MED ORDER — GABAPENTIN 400 MG PO CAPS
400.0000 mg | ORAL_CAPSULE | Freq: Three times a day (TID) | ORAL | Status: DC
  Administered 2023-08-11 – 2023-08-12 (×3): 400 mg via ORAL
  Filled 2023-08-11 (×3): qty 1

## 2023-08-11 MED ORDER — METHOCARBAMOL 500 MG PO TABS
1000.0000 mg | ORAL_TABLET | Freq: Three times a day (TID) | ORAL | Status: DC
  Administered 2023-08-11 – 2023-08-12 (×3): 1000 mg via ORAL
  Filled 2023-08-11 (×3): qty 2

## 2023-08-11 MED ORDER — OXYCODONE HCL 5 MG PO TABS
10.0000 mg | ORAL_TABLET | Freq: Four times a day (QID) | ORAL | Status: DC | PRN
  Administered 2023-08-11: 10 mg via ORAL
  Filled 2023-08-11: qty 2

## 2023-08-11 MED ORDER — IBUPROFEN 800 MG PO TABS
800.0000 mg | ORAL_TABLET | Freq: Two times a day (BID) | ORAL | Status: DC
  Administered 2023-08-11 (×2): 800 mg via ORAL
  Filled 2023-08-11 (×2): qty 1

## 2023-08-11 MED ORDER — HYDROMORPHONE HCL 1 MG/ML IJ SOLN: 0.5000 mg | INTRAMUSCULAR | Status: DC | PRN
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
## 2023-08-11 NOTE — Progress Notes (Signed)
 Orthopedic Tech Progress Note Patient Details:  Craig Santos September 20, 2005 [id number] Called/secure chat OT about radial palsy splint. No response. Patient ID: Craig Santos, male   DOB: [birth year], 18 y.o.   MRN: [id number]  Craig Santos Cos 08/11/2023, 3:58 PM
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
## 2023-08-11 NOTE — Plan of Care (Signed)
  Problem: Clinical Measurements: Goal: Respiratory complications will improve Outcome: Progressing   Problem: Nutrition: Goal: Adequate nutrition will be maintained Outcome: Progressing   Problem: Coping: Goal: Level of anxiety will decrease Outcome: Progressing   Problem: Elimination: Goal: Will not experience complications related to bowel motility Outcome: Progressing   Problem: Elimination: Goal: Will not experience complications related to urinary retention 08/11/2023 0835 by Stasia Lorane DASEN, RN Outcome: Progressing 08/11/2023 0835 by Stasia Lorane DASEN, RN Outcome: Progressing   Problem: Pain Managment: Goal: General experience of comfort will improve and/or be controlled 08/11/2023 0835 by Stasia Lorane DASEN, RN Outcome: Progressing 08/11/2023 0835 by Stasia Lorane DASEN, RN Outcome: Progressing   Problem: Safety: Goal: Ability to remain free from injury will improve 08/11/2023 0835 by Stasia Lorane DASEN, RN Outcome: Progressing 08/11/2023 0835 by Stasia Lorane DASEN, RN Outcome: Progressing
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
## 2023-08-12 ENCOUNTER — Encounter (HOSPITAL_COMMUNITY): Payer: Self-pay | Admitting: *Deleted

## 2023-08-12 ENCOUNTER — Other Ambulatory Visit (HOSPITAL_COMMUNITY): Payer: Self-pay

## 2023-08-12 LAB — VITAMIN D 25 HYDROXY (VIT D DEFICIENCY, FRACTURES): Vit D, 25-Hydroxy: 12.81 ng/mL — ABNORMAL LOW (ref 30–100)

## 2023-08-12 MED ORDER — DOCUSATE SODIUM 100 MG PO CAPS: 100.0000 mg | ORAL_CAPSULE | Freq: Two times a day (BID) | ORAL | Status: AC | PRN

## 2023-08-12 MED ORDER — GABAPENTIN 400 MG PO CAPS
400.0000 mg | ORAL_CAPSULE | Freq: Three times a day (TID) | ORAL | 0 refills | Status: AC | PRN
  Filled 2023-08-12: qty 60, 20d supply, fill #0

## 2023-08-12 MED ORDER — ACETAMINOPHEN 500 MG PO TABS
1000.0000 mg | ORAL_TABLET | Freq: Three times a day (TID) | ORAL | Status: AC | PRN
Start: 2023-08-12 — End: ?

## 2023-08-12 MED ORDER — POLYETHYLENE GLYCOL 3350 17 G PO PACK: 17.0000 g | PACK | Freq: Two times a day (BID) | ORAL | Status: AC | PRN

## 2023-08-12 MED ORDER — BACITRACIN ZINC 500 UNIT/GM EX OINT
TOPICAL_OINTMENT | Freq: Two times a day (BID) | CUTANEOUS | 0 refills | Status: AC
  Filled 2023-08-12: qty 113.6, 7d supply, fill #0

## 2023-08-12 MED ORDER — METHOCARBAMOL 500 MG PO TABS
1000.0000 mg | ORAL_TABLET | Freq: Three times a day (TID) | ORAL | 0 refills | Status: AC | PRN
  Filled 2023-08-12: qty 45, 8d supply, fill #0

## 2023-08-12 MED ORDER — IBUPROFEN 800 MG PO TABS: 800.0000 mg | ORAL_TABLET | Freq: Two times a day (BID) | ORAL | Status: AC | PRN

## 2023-08-12 MED ORDER — ASCORBIC ACID 1000 MG PO TABS: 1000.0000 mg | ORAL_TABLET | Freq: Every day | ORAL | Status: AC

## 2023-08-12 MED ORDER — OXYCODONE HCL 10 MG PO TABS
10.0000 mg | ORAL_TABLET | Freq: Four times a day (QID) | ORAL | 0 refills | Status: AC | PRN
  Filled 2023-08-12: qty 15, 5d supply, fill #0
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
## 2023-08-12 NOTE — Plan of Care (Signed)
  Problem: Education: Goal: Knowledge of General Education information will improve Description: Including pain rating scale, medication(s)/side effects and non-pharmacologic comfort measures Outcome: Adequate for Discharge   Problem: Health Behavior/Discharge Planning: Goal: Ability to manage health-related needs will improve Outcome: Adequate for Discharge   Problem: Activity: Goal: Risk for activity intolerance will decrease Outcome: Adequate for Discharge   Problem: Nutrition: Goal: Adequate nutrition will be maintained Outcome: Adequate for Discharge   Problem: Coping: Goal: Level of anxiety will decrease Outcome: Adequate for Discharge   Problem: Elimination: Goal: Will not experience complications related to bowel motility Outcome: Adequate for Discharge   Problem: Pain Managment: Goal: General experience of comfort will improve and/or be controlled Outcome: Adequate for Discharge   Problem: Safety: Goal: Ability to remain free from injury will improve Outcome: Adequate for Discharge   Problem: Skin Integrity: Goal: Risk for impaired skin integrity will decrease Outcome: Adequate for Discharge
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
## 2023-08-12 NOTE — TOC Transition Note (Signed)
 Transition of Care [facility]) - Discharge Note   Patient Details  Name: Craig Santos MRN: [id number] Date of Birth: 15-Sep-2005  Transition of [facility]) CM/SW Contact:  Zamariah Seaborn E Geanie Pacifico, LCSW Phone Number: 08/12/2023, 10:30 AM   Clinical Narrative:    Patient has orders to DC home today. Confirmed RNCM has made referral for OP Rehab to [facility].   Final next level of care: OP Rehab Barriers to Discharge: Barriers Resolved   Patient Goals and CMS Choice            Discharge Placement                       Discharge Plan and Services Additional resources added to the After Visit Summary for                                       Social Drivers of Health (SDOH) Interventions SDOH Screenings   Food Insecurity: No Food Insecurity (08/06/2023)  Housing: Low Risk  (08/06/2023)  Transportation Needs: No Transportation Needs (08/06/2023)  Utilities: Not At Risk (08/06/2023)  Tobacco Use: High Risk (08/11/2023)     Readmission Risk Interventions     No data to display

## 2023-08-23 ENCOUNTER — Ambulatory Visit

## 2023-08-24 ENCOUNTER — Ambulatory Visit: Attending: General Surgery | Admitting: Physical Therapy

## 2023-08-24 DIAGNOSIS — R269 Unspecified abnormalities of gait and mobility: Secondary | ICD-10-CM | POA: Diagnosis present

## 2023-08-24 DIAGNOSIS — R29898 Other symptoms and signs involving the musculoskeletal system: Secondary | ICD-10-CM | POA: Insufficient documentation

## 2023-08-24 DIAGNOSIS — R262 Difficulty in walking, not elsewhere classified: Secondary | ICD-10-CM | POA: Insufficient documentation

## 2023-08-24 DIAGNOSIS — M21372 Foot drop, left foot: Secondary | ICD-10-CM | POA: Insufficient documentation

## 2023-08-24 DIAGNOSIS — M25672 Stiffness of left ankle, not elsewhere classified: Secondary | ICD-10-CM | POA: Insufficient documentation

## 2023-08-24 DIAGNOSIS — M6281 Muscle weakness (generalized): Secondary | ICD-10-CM | POA: Insufficient documentation
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
## 2023-08-29 ENCOUNTER — Ambulatory Visit

## 2023-08-30 ENCOUNTER — Ambulatory Visit

## 2023-08-30 DIAGNOSIS — M6281 Muscle weakness (generalized): Secondary | ICD-10-CM

## 2023-08-30 DIAGNOSIS — R262 Difficulty in walking, not elsewhere classified: Secondary | ICD-10-CM

## 2023-08-30 DIAGNOSIS — M21372 Foot drop, left foot: Secondary | ICD-10-CM

## 2023-08-30 DIAGNOSIS — R29898 Other symptoms and signs involving the musculoskeletal system: Secondary | ICD-10-CM

## 2023-08-30 DIAGNOSIS — R269 Unspecified abnormalities of gait and mobility: Secondary | ICD-10-CM | POA: Diagnosis not present

## 2023-08-30 DIAGNOSIS — M25672 Stiffness of left ankle, not elsewhere classified: Secondary | ICD-10-CM
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
## 2023-09-02 ENCOUNTER — Ambulatory Visit

## 2023-09-02 DIAGNOSIS — M21372 Foot drop, left foot: Secondary | ICD-10-CM

## 2023-09-02 DIAGNOSIS — R29898 Other symptoms and signs involving the musculoskeletal system: Secondary | ICD-10-CM

## 2023-09-02 DIAGNOSIS — M25672 Stiffness of left ankle, not elsewhere classified: Secondary | ICD-10-CM

## 2023-09-02 DIAGNOSIS — R269 Unspecified abnormalities of gait and mobility: Secondary | ICD-10-CM | POA: Diagnosis not present

## 2023-09-02 DIAGNOSIS — R262 Difficulty in walking, not elsewhere classified: Secondary | ICD-10-CM

## 2023-09-02 DIAGNOSIS — M6281 Muscle weakness (generalized): Secondary | ICD-10-CM
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
## 2023-09-05 ENCOUNTER — Ambulatory Visit

## 2023-09-05 DIAGNOSIS — R262 Difficulty in walking, not elsewhere classified: Secondary | ICD-10-CM

## 2023-09-05 DIAGNOSIS — R269 Unspecified abnormalities of gait and mobility: Secondary | ICD-10-CM | POA: Diagnosis not present

## 2023-09-05 DIAGNOSIS — M6281 Muscle weakness (generalized): Secondary | ICD-10-CM

## 2023-09-05 DIAGNOSIS — R29898 Other symptoms and signs involving the musculoskeletal system: Secondary | ICD-10-CM

## 2023-09-05 DIAGNOSIS — M21372 Foot drop, left foot: Secondary | ICD-10-CM

## 2023-09-05 DIAGNOSIS — M25672 Stiffness of left ankle, not elsewhere classified: Secondary | ICD-10-CM
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
## 2023-09-08 ENCOUNTER — Ambulatory Visit: Admitting: Physical Therapy

## 2023-09-08 DIAGNOSIS — M21372 Foot drop, left foot: Secondary | ICD-10-CM

## 2023-09-08 DIAGNOSIS — R269 Unspecified abnormalities of gait and mobility: Secondary | ICD-10-CM | POA: Diagnosis not present

## 2023-09-08 DIAGNOSIS — M6281 Muscle weakness (generalized): Secondary | ICD-10-CM

## 2023-09-08 DIAGNOSIS — R29898 Other symptoms and signs involving the musculoskeletal system: Secondary | ICD-10-CM

## 2023-09-08 DIAGNOSIS — R262 Difficulty in walking, not elsewhere classified: Secondary | ICD-10-CM

## 2023-09-08 DIAGNOSIS — M25672 Stiffness of left ankle, not elsewhere classified: Secondary | ICD-10-CM
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
## 2023-09-12 ENCOUNTER — Ambulatory Visit

## 2023-09-15 ENCOUNTER — Ambulatory Visit: Admitting: Physical Therapy

## 2023-09-19 ENCOUNTER — Ambulatory Visit

## 2023-09-19 DIAGNOSIS — M6281 Muscle weakness (generalized): Secondary | ICD-10-CM

## 2023-09-19 DIAGNOSIS — R269 Unspecified abnormalities of gait and mobility: Secondary | ICD-10-CM | POA: Diagnosis not present

## 2023-09-19 DIAGNOSIS — M25672 Stiffness of left ankle, not elsewhere classified: Secondary | ICD-10-CM

## 2023-09-19 DIAGNOSIS — R29898 Other symptoms and signs involving the musculoskeletal system: Secondary | ICD-10-CM

## 2023-09-19 DIAGNOSIS — M21372 Foot drop, left foot: Secondary | ICD-10-CM

## 2023-09-19 DIAGNOSIS — R262 Difficulty in walking, not elsewhere classified: Secondary | ICD-10-CM
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
## 2023-09-22 ENCOUNTER — Ambulatory Visit

## 2023-09-22 DIAGNOSIS — M21372 Foot drop, left foot: Secondary | ICD-10-CM

## 2023-09-22 DIAGNOSIS — M25672 Stiffness of left ankle, not elsewhere classified: Secondary | ICD-10-CM

## 2023-09-22 DIAGNOSIS — R29898 Other symptoms and signs involving the musculoskeletal system: Secondary | ICD-10-CM

## 2023-09-22 DIAGNOSIS — R269 Unspecified abnormalities of gait and mobility: Secondary | ICD-10-CM

## 2023-09-22 DIAGNOSIS — R262 Difficulty in walking, not elsewhere classified: Secondary | ICD-10-CM

## 2023-09-22 DIAGNOSIS — M6281 Muscle weakness (generalized): Secondary | ICD-10-CM
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
## 2023-09-26 ENCOUNTER — Ambulatory Visit: Attending: General Surgery

## 2023-09-26 ENCOUNTER — Ambulatory Visit: Admitting: Occupational Therapy

## 2023-09-26 DIAGNOSIS — R262 Difficulty in walking, not elsewhere classified: Secondary | ICD-10-CM | POA: Insufficient documentation

## 2023-09-26 DIAGNOSIS — R278 Other lack of coordination: Secondary | ICD-10-CM | POA: Diagnosis present

## 2023-09-26 DIAGNOSIS — M25672 Stiffness of left ankle, not elsewhere classified: Secondary | ICD-10-CM | POA: Diagnosis present

## 2023-09-26 DIAGNOSIS — M6281 Muscle weakness (generalized): Secondary | ICD-10-CM | POA: Diagnosis present

## 2023-09-26 DIAGNOSIS — R29898 Other symptoms and signs involving the musculoskeletal system: Secondary | ICD-10-CM | POA: Diagnosis present

## 2023-09-26 DIAGNOSIS — M21372 Foot drop, left foot: Secondary | ICD-10-CM | POA: Diagnosis present

## 2023-09-26 DIAGNOSIS — R269 Unspecified abnormalities of gait and mobility: Secondary | ICD-10-CM | POA: Insufficient documentation
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
## 2023-09-28 ENCOUNTER — Ambulatory Visit

## 2023-09-28 ENCOUNTER — Ambulatory Visit: Admitting: Occupational Therapy

## 2023-09-28 DIAGNOSIS — M21372 Foot drop, left foot: Secondary | ICD-10-CM | POA: Diagnosis not present

## 2023-09-28 DIAGNOSIS — R29898 Other symptoms and signs involving the musculoskeletal system: Secondary | ICD-10-CM

## 2023-09-28 DIAGNOSIS — M6281 Muscle weakness (generalized): Secondary | ICD-10-CM

## 2023-09-28 DIAGNOSIS — R269 Unspecified abnormalities of gait and mobility: Secondary | ICD-10-CM

## 2023-09-28 DIAGNOSIS — M25672 Stiffness of left ankle, not elsewhere classified: Secondary | ICD-10-CM

## 2023-09-28 DIAGNOSIS — R278 Other lack of coordination: Secondary | ICD-10-CM

## 2023-09-28 DIAGNOSIS — R262 Difficulty in walking, not elsewhere classified: Secondary | ICD-10-CM
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
## 2023-09-29 ENCOUNTER — Ambulatory Visit

## 2023-09-30 ENCOUNTER — Ambulatory Visit: Admitting: Occupational Therapy

## 2023-09-30 DIAGNOSIS — R278 Other lack of coordination: Secondary | ICD-10-CM

## 2023-09-30 DIAGNOSIS — M6281 Muscle weakness (generalized): Secondary | ICD-10-CM

## 2023-09-30 DIAGNOSIS — M21372 Foot drop, left foot: Secondary | ICD-10-CM | POA: Diagnosis not present
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
## 2023-10-03 ENCOUNTER — Ambulatory Visit

## 2023-10-03 ENCOUNTER — Ambulatory Visit: Admitting: Occupational Therapy

## 2023-10-03 ENCOUNTER — Encounter: Payer: Self-pay | Admitting: Occupational Therapy

## 2023-10-03 DIAGNOSIS — R262 Difficulty in walking, not elsewhere classified: Secondary | ICD-10-CM

## 2023-10-03 DIAGNOSIS — M21372 Foot drop, left foot: Secondary | ICD-10-CM | POA: Diagnosis not present

## 2023-10-03 DIAGNOSIS — M6281 Muscle weakness (generalized): Secondary | ICD-10-CM

## 2023-10-03 DIAGNOSIS — R278 Other lack of coordination: Secondary | ICD-10-CM

## 2023-10-03 DIAGNOSIS — R29898 Other symptoms and signs involving the musculoskeletal system: Secondary | ICD-10-CM

## 2023-10-03 DIAGNOSIS — R269 Unspecified abnormalities of gait and mobility: Secondary | ICD-10-CM

## 2023-10-03 DIAGNOSIS — M25672 Stiffness of left ankle, not elsewhere classified: Secondary | ICD-10-CM
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
## 2023-10-06 ENCOUNTER — Ambulatory Visit

## 2023-10-06 DIAGNOSIS — M6281 Muscle weakness (generalized): Secondary | ICD-10-CM

## 2023-10-06 DIAGNOSIS — M21372 Foot drop, left foot: Secondary | ICD-10-CM | POA: Diagnosis not present

## 2023-10-06 DIAGNOSIS — M25672 Stiffness of left ankle, not elsewhere classified: Secondary | ICD-10-CM

## 2023-10-06 DIAGNOSIS — R262 Difficulty in walking, not elsewhere classified: Secondary | ICD-10-CM

## 2023-10-06 DIAGNOSIS — R269 Unspecified abnormalities of gait and mobility: Secondary | ICD-10-CM

## 2023-10-06 DIAGNOSIS — R29898 Other symptoms and signs involving the musculoskeletal system: Secondary | ICD-10-CM

## 2023-10-06 DIAGNOSIS — R278 Other lack of coordination: Secondary | ICD-10-CM
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
## 2023-10-10 ENCOUNTER — Ambulatory Visit

## 2023-10-10 ENCOUNTER — Ambulatory Visit: Admitting: Occupational Therapy

## 2023-10-13 ENCOUNTER — Ambulatory Visit

## 2023-10-17 ENCOUNTER — Ambulatory Visit: Admitting: Occupational Therapy

## 2023-10-17 ENCOUNTER — Encounter: Admitting: Occupational Therapy

## 2023-10-17 ENCOUNTER — Ambulatory Visit

## 2023-10-17 DIAGNOSIS — M21372 Foot drop, left foot: Secondary | ICD-10-CM | POA: Diagnosis not present

## 2023-10-17 DIAGNOSIS — R278 Other lack of coordination: Secondary | ICD-10-CM

## 2023-10-17 DIAGNOSIS — M6281 Muscle weakness (generalized): Secondary | ICD-10-CM
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
## 2023-10-19 ENCOUNTER — Ambulatory Visit: Admitting: Occupational Therapy

## 2023-10-19 ENCOUNTER — Ambulatory Visit

## 2023-10-19 DIAGNOSIS — M21372 Foot drop, left foot: Secondary | ICD-10-CM | POA: Diagnosis not present

## 2023-10-19 DIAGNOSIS — M25672 Stiffness of left ankle, not elsewhere classified: Secondary | ICD-10-CM

## 2023-10-19 DIAGNOSIS — M6281 Muscle weakness (generalized): Secondary | ICD-10-CM

## 2023-10-19 DIAGNOSIS — R29898 Other symptoms and signs involving the musculoskeletal system: Secondary | ICD-10-CM

## 2023-10-19 DIAGNOSIS — R278 Other lack of coordination: Secondary | ICD-10-CM
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
## 2023-10-21 ENCOUNTER — Ambulatory Visit: Admitting: Occupational Therapy

## 2023-10-21 ENCOUNTER — Ambulatory Visit

## 2023-10-21 DIAGNOSIS — M6281 Muscle weakness (generalized): Secondary | ICD-10-CM

## 2023-10-21 DIAGNOSIS — M21372 Foot drop, left foot: Secondary | ICD-10-CM | POA: Diagnosis not present

## 2023-10-21 DIAGNOSIS — R278 Other lack of coordination: Secondary | ICD-10-CM
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
## 2023-10-26 ENCOUNTER — Ambulatory Visit: Attending: General Surgery | Admitting: Occupational Therapy

## 2023-10-26 ENCOUNTER — Encounter: Admitting: Occupational Therapy

## 2023-10-26 DIAGNOSIS — M25672 Stiffness of left ankle, not elsewhere classified: Secondary | ICD-10-CM | POA: Insufficient documentation

## 2023-10-26 DIAGNOSIS — M6281 Muscle weakness (generalized): Secondary | ICD-10-CM | POA: Insufficient documentation

## 2023-10-26 DIAGNOSIS — M21372 Foot drop, left foot: Secondary | ICD-10-CM | POA: Insufficient documentation

## 2023-10-26 DIAGNOSIS — R269 Unspecified abnormalities of gait and mobility: Secondary | ICD-10-CM | POA: Diagnosis present

## 2023-10-26 DIAGNOSIS — R278 Other lack of coordination: Secondary | ICD-10-CM | POA: Diagnosis present

## 2023-10-26 DIAGNOSIS — R29898 Other symptoms and signs involving the musculoskeletal system: Secondary | ICD-10-CM | POA: Diagnosis present

## 2023-10-26 DIAGNOSIS — R262 Difficulty in walking, not elsewhere classified: Secondary | ICD-10-CM | POA: Diagnosis present
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
## 2023-10-27 ENCOUNTER — Ambulatory Visit

## 2023-10-31 ENCOUNTER — Encounter: Admitting: Occupational Therapy

## 2023-10-31 ENCOUNTER — Ambulatory Visit: Admitting: Occupational Therapy

## 2023-10-31 ENCOUNTER — Ambulatory Visit

## 2023-10-31 DIAGNOSIS — M6281 Muscle weakness (generalized): Secondary | ICD-10-CM | POA: Diagnosis not present
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
## 2023-11-02 ENCOUNTER — Ambulatory Visit: Admitting: Occupational Therapy

## 2023-11-02 ENCOUNTER — Ambulatory Visit

## 2023-11-02 ENCOUNTER — Encounter: Payer: Self-pay | Admitting: Physical Therapy

## 2023-11-02 DIAGNOSIS — R278 Other lack of coordination: Secondary | ICD-10-CM

## 2023-11-02 DIAGNOSIS — M25672 Stiffness of left ankle, not elsewhere classified: Secondary | ICD-10-CM

## 2023-11-02 DIAGNOSIS — M6281 Muscle weakness (generalized): Secondary | ICD-10-CM | POA: Diagnosis not present

## 2023-11-02 DIAGNOSIS — R262 Difficulty in walking, not elsewhere classified: Secondary | ICD-10-CM

## 2023-11-02 DIAGNOSIS — R29898 Other symptoms and signs involving the musculoskeletal system: Secondary | ICD-10-CM

## 2023-11-02 DIAGNOSIS — M21372 Foot drop, left foot: Secondary | ICD-10-CM

## 2023-11-02 DIAGNOSIS — R269 Unspecified abnormalities of gait and mobility: Secondary | ICD-10-CM
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
## 2023-11-03 ENCOUNTER — Ambulatory Visit

## 2023-11-07 ENCOUNTER — Encounter: Admitting: Occupational Therapy

## 2023-11-07 ENCOUNTER — Telehealth: Payer: Self-pay

## 2023-11-07 ENCOUNTER — Ambulatory Visit: Admitting: Occupational Therapy

## 2023-11-07 ENCOUNTER — Ambulatory Visit

## 2023-11-07 NOTE — Telephone Encounter (Signed)
 Patient Name: Craig Santos MRN: [id number] DOB:May 08, 2005, 18 y.o., male Today's Date: 11/07/2023  Pt contacted via telephone and author left voice mail informing of missed appointment and informed pt of future PT appointment date and time.        [name], PT 11/07/2023, 9:08 AM
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
## 2023-11-09 ENCOUNTER — Ambulatory Visit: Admitting: Occupational Therapy

## 2023-11-09 ENCOUNTER — Ambulatory Visit

## 2023-11-09 ENCOUNTER — Encounter: Admitting: Occupational Therapy

## 2023-11-09 DIAGNOSIS — M6281 Muscle weakness (generalized): Secondary | ICD-10-CM | POA: Diagnosis not present

## 2023-11-09 DIAGNOSIS — M21372 Foot drop, left foot: Secondary | ICD-10-CM

## 2023-11-09 DIAGNOSIS — R29898 Other symptoms and signs involving the musculoskeletal system: Secondary | ICD-10-CM

## 2023-11-09 DIAGNOSIS — R278 Other lack of coordination: Secondary | ICD-10-CM
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
## 2023-11-10 ENCOUNTER — Ambulatory Visit

## 2023-11-14 ENCOUNTER — Ambulatory Visit

## 2023-11-14 ENCOUNTER — Ambulatory Visit: Admitting: Occupational Therapy

## 2023-11-16 ENCOUNTER — Ambulatory Visit: Admitting: Physical Therapy

## 2023-11-16 ENCOUNTER — Ambulatory Visit: Admitting: Occupational Therapy

## 2023-11-16 DIAGNOSIS — M6281 Muscle weakness (generalized): Secondary | ICD-10-CM
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
## 2023-11-21 ENCOUNTER — Ambulatory Visit

## 2023-11-21 DIAGNOSIS — R278 Other lack of coordination: Secondary | ICD-10-CM

## 2023-11-21 DIAGNOSIS — M6281 Muscle weakness (generalized): Secondary | ICD-10-CM
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
## 2023-11-23 ENCOUNTER — Ambulatory Visit: Attending: General Surgery | Admitting: Occupational Therapy

## 2023-11-23 ENCOUNTER — Ambulatory Visit: Admitting: Physical Therapy

## 2023-11-23 DIAGNOSIS — R278 Other lack of coordination: Secondary | ICD-10-CM | POA: Insufficient documentation

## 2023-11-23 DIAGNOSIS — M6281 Muscle weakness (generalized): Secondary | ICD-10-CM | POA: Diagnosis present
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
## 2023-11-28 ENCOUNTER — Telehealth: Payer: Self-pay | Admitting: Occupational Therapy

## 2023-11-28 ENCOUNTER — Ambulatory Visit: Admitting: Occupational Therapy

## 2023-11-28 ENCOUNTER — Ambulatory Visit

## 2023-11-28 NOTE — Telephone Encounter (Signed)
 Pt. did not arrive for his therapy appointment this morning. A follow-up courtesy call was made. Pt. woke up with a migraine, and was unable to attend and participate in therapy this morning. Pt. was provided with his next appointment date, and time.

## 2023-11-30 ENCOUNTER — Ambulatory Visit: Admitting: Occupational Therapy

## 2023-11-30 ENCOUNTER — Ambulatory Visit

## 2023-11-30 DIAGNOSIS — M6281 Muscle weakness (generalized): Secondary | ICD-10-CM | POA: Diagnosis not present
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
## 2023-12-05 ENCOUNTER — Ambulatory Visit: Admitting: Occupational Therapy

## 2023-12-05 ENCOUNTER — Ambulatory Visit

## 2023-12-06 ENCOUNTER — Ambulatory Visit: Admitting: Occupational Therapy

## 2023-12-06 DIAGNOSIS — M6281 Muscle weakness (generalized): Secondary | ICD-10-CM
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
## 2023-12-07 ENCOUNTER — Ambulatory Visit

## 2023-12-07 DIAGNOSIS — M6281 Muscle weakness (generalized): Secondary | ICD-10-CM | POA: Diagnosis not present

## 2023-12-07 DIAGNOSIS — R278 Other lack of coordination: Secondary | ICD-10-CM
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
## 2023-12-12 ENCOUNTER — Ambulatory Visit: Admitting: Occupational Therapy

## 2023-12-12 ENCOUNTER — Ambulatory Visit

## 2023-12-12 NOTE — Telephone Encounter (Signed)
 Pt. did not show up for his therapy appointment this morning. An attempt was made to reach to out to the Pt. via telephone, and to provide him with his next scheduled appointment time. There was no answer on the line.

## 2023-12-14 ENCOUNTER — Ambulatory Visit: Admitting: Occupational Therapy

## 2023-12-14 ENCOUNTER — Ambulatory Visit

## 2023-12-14 DIAGNOSIS — M6281 Muscle weakness (generalized): Secondary | ICD-10-CM
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
## 2023-12-19 ENCOUNTER — Ambulatory Visit: Admitting: Physical Therapy

## 2023-12-19 ENCOUNTER — Ambulatory Visit: Admitting: Occupational Therapy

## 2023-12-19 DIAGNOSIS — M6281 Muscle weakness (generalized): Secondary | ICD-10-CM

## 2023-12-19 DIAGNOSIS — R278 Other lack of coordination: Secondary | ICD-10-CM
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
## 2023-12-21 ENCOUNTER — Ambulatory Visit: Admitting: Occupational Therapy

## 2023-12-21 ENCOUNTER — Ambulatory Visit

## 2023-12-26 ENCOUNTER — Ambulatory Visit

## 2023-12-26 ENCOUNTER — Ambulatory Visit: Attending: General Surgery | Admitting: Occupational Therapy

## 2023-12-26 DIAGNOSIS — R278 Other lack of coordination: Secondary | ICD-10-CM | POA: Diagnosis present

## 2023-12-26 DIAGNOSIS — M6281 Muscle weakness (generalized): Secondary | ICD-10-CM | POA: Diagnosis present
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
## 2023-12-28 ENCOUNTER — Ambulatory Visit

## 2023-12-28 ENCOUNTER — Ambulatory Visit: Admitting: Occupational Therapy

## 2023-12-28 DIAGNOSIS — M6281 Muscle weakness (generalized): Secondary | ICD-10-CM
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
## 2024-01-02 ENCOUNTER — Ambulatory Visit

## 2024-01-02 DIAGNOSIS — R278 Other lack of coordination: Secondary | ICD-10-CM

## 2024-01-02 DIAGNOSIS — M6281 Muscle weakness (generalized): Secondary | ICD-10-CM | POA: Diagnosis not present
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
## 2024-01-04 ENCOUNTER — Ambulatory Visit: Admitting: Occupational Therapy

## 2024-01-04 DIAGNOSIS — M6281 Muscle weakness (generalized): Secondary | ICD-10-CM
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
## 2024-01-09 ENCOUNTER — Ambulatory Visit: Admitting: Occupational Therapy

## 2024-01-09 DIAGNOSIS — M6281 Muscle weakness (generalized): Secondary | ICD-10-CM | POA: Diagnosis not present
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
## 2024-01-11 ENCOUNTER — Telehealth: Payer: Self-pay | Admitting: Occupational Therapy

## 2024-01-11 ENCOUNTER — Ambulatory Visit: Admitting: Occupational Therapy

## 2024-01-11 NOTE — Telephone Encounter (Signed)
 Pt. did not arrive for his therapy appointment this morning. An attempt was made to follow-up with a courtesy call with the information about the next scheduled appointment time. However, was unable to reach the Pt. via telephone call.

## 2024-01-16 ENCOUNTER — Ambulatory Visit

## 2024-01-17 ENCOUNTER — Ambulatory Visit: Admitting: Occupational Therapy

## 2024-01-23 ENCOUNTER — Ambulatory Visit: Attending: General Surgery

## 2024-01-23 DIAGNOSIS — M6281 Muscle weakness (generalized): Secondary | ICD-10-CM | POA: Insufficient documentation

## 2024-01-23 NOTE — Telephone Encounter (Signed)
 Follow up call to notify pt of missed OT visit this morning. Pt reported not having appt written down on his calendar. Pt notified up upcoming visit on 01/25/24; pt confirmed he will be able to attend.   Inocente Blazing, MS, OTR/L

## 2024-01-24 ENCOUNTER — Ambulatory Visit: Admitting: Occupational Therapy

## 2024-01-25 ENCOUNTER — Ambulatory Visit: Admitting: Occupational Therapy

## 2024-01-26 ENCOUNTER — Ambulatory Visit: Admitting: Occupational Therapy

## 2024-01-30 ENCOUNTER — Ambulatory Visit

## 2024-01-31 ENCOUNTER — Ambulatory Visit: Admitting: Occupational Therapy

## 2024-02-01 ENCOUNTER — Ambulatory Visit: Admitting: Occupational Therapy

## 2024-02-02 ENCOUNTER — Ambulatory Visit: Admitting: Occupational Therapy

## 2024-02-06 ENCOUNTER — Ambulatory Visit

## 2024-02-07 ENCOUNTER — Ambulatory Visit: Admitting: Occupational Therapy

## 2024-02-07 DIAGNOSIS — M6281 Muscle weakness (generalized): Secondary | ICD-10-CM
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
## 2024-02-08 ENCOUNTER — Ambulatory Visit: Admitting: Occupational Therapy

## 2024-02-09 ENCOUNTER — Ambulatory Visit: Admitting: Occupational Therapy

## 2024-02-13 ENCOUNTER — Ambulatory Visit

## 2024-02-14 ENCOUNTER — Ambulatory Visit: Admitting: Occupational Therapy

## 2024-02-20 ENCOUNTER — Ambulatory Visit: Admitting: Occupational Therapy

## 2024-02-21 ENCOUNTER — Ambulatory Visit: Admitting: Occupational Therapy

## 2024-02-22 ENCOUNTER — Ambulatory Visit: Admitting: Occupational Therapy

## 2024-02-27 ENCOUNTER — Ambulatory Visit: Admitting: Occupational Therapy

## 2024-02-28 ENCOUNTER — Ambulatory Visit: Admitting: Occupational Therapy

## 2024-02-29 ENCOUNTER — Ambulatory Visit: Admitting: Occupational Therapy

## 2024-03-01 ENCOUNTER — Ambulatory Visit: Admitting: Occupational Therapy

## 2024-03-05 ENCOUNTER — Ambulatory Visit: Admitting: Occupational Therapy

## 2024-03-06 ENCOUNTER — Ambulatory Visit: Admitting: Occupational Therapy

## 2024-03-07 ENCOUNTER — Ambulatory Visit

## 2024-03-08 ENCOUNTER — Ambulatory Visit: Admitting: Occupational Therapy

## 2024-03-12 ENCOUNTER — Ambulatory Visit: Admitting: Occupational Therapy

## 2024-03-13 ENCOUNTER — Ambulatory Visit: Admitting: Occupational Therapy

## 2024-03-14 ENCOUNTER — Ambulatory Visit: Admitting: Occupational Therapy

## 2024-03-15 ENCOUNTER — Ambulatory Visit: Admitting: Occupational Therapy

## 2024-03-20 ENCOUNTER — Ambulatory Visit: Admitting: Occupational Therapy

## 2024-03-27 ENCOUNTER — Ambulatory Visit: Admitting: Occupational Therapy

## 2024-04-03 ENCOUNTER — Ambulatory Visit: Admitting: Occupational Therapy

## 2024-04-10 ENCOUNTER — Ambulatory Visit: Admitting: Occupational Therapy

## 2024-04-17 ENCOUNTER — Ambulatory Visit: Admitting: Occupational Therapy
